# Patient Record
Sex: Male | Born: 1975 | Race: Black or African American | Hispanic: No | Marital: Married | Smoking: Never smoker
Health system: Southern US, Community
[De-identification: ages and names within clinical notes are randomized; demographics above are authoritative.]

## PROBLEM LIST (undated history)

## (undated) DIAGNOSIS — I10 Essential (primary) hypertension: Secondary | ICD-10-CM

## (undated) DIAGNOSIS — R42 Dizziness and giddiness: Secondary | ICD-10-CM

---

## 2016-10-06 ENCOUNTER — Observation Stay (HOSPITAL_COMMUNITY)
Admission: EM | Admit: 2016-10-06 | Discharge: 2016-10-07 | Disposition: A | Discharge status: Home / Self Care | Payer: Medicare Other | Attending: Internal Medicine | Admitting: Internal Medicine

## 2016-10-06 ENCOUNTER — Encounter (HOSPITAL_COMMUNITY): Payer: Self-pay | Admitting: Emergency Medicine

## 2016-10-06 ENCOUNTER — Observation Stay (HOSPITAL_COMMUNITY): Payer: Medicare Other

## 2016-10-06 DIAGNOSIS — E860 Dehydration: Secondary | ICD-10-CM | POA: Insufficient documentation

## 2016-10-06 DIAGNOSIS — R7309 Other abnormal glucose: Secondary | ICD-10-CM

## 2016-10-06 DIAGNOSIS — F1290 Cannabis use, unspecified, uncomplicated: Secondary | ICD-10-CM | POA: Diagnosis not present

## 2016-10-06 DIAGNOSIS — R197 Diarrhea, unspecified: Secondary | ICD-10-CM | POA: Diagnosis present

## 2016-10-06 DIAGNOSIS — H5500 Unspecified nystagmus: Secondary | ICD-10-CM | POA: Insufficient documentation

## 2016-10-06 DIAGNOSIS — R42 Dizziness and giddiness: Secondary | ICD-10-CM

## 2016-10-06 DIAGNOSIS — N179 Acute kidney failure, unspecified: Secondary | ICD-10-CM | POA: Diagnosis not present

## 2016-10-06 DIAGNOSIS — I10 Essential (primary) hypertension: Principal | ICD-10-CM

## 2016-10-06 DIAGNOSIS — R112 Nausea with vomiting, unspecified: Secondary | ICD-10-CM

## 2016-10-06 HISTORY — DX: Dizziness and giddiness: R42

## 2016-10-06 HISTORY — DX: Essential (primary) hypertension: I10

## 2016-10-06 LAB — URINALYSIS, ROUTINE W REFLEX MICROSCOPIC
BILIRUBIN URINE: NEGATIVE
Glucose, UA: NEGATIVE mg/dL
Hgb urine dipstick: NEGATIVE
KETONES UR: NEGATIVE mg/dL
LEUKOCYTES UA: NEGATIVE
NITRITE: NEGATIVE
PH: 7 (ref 5.0–8.0)
PROTEIN: NEGATIVE mg/dL
Specific Gravity, Urine: 1.012 (ref 1.005–1.030)

## 2016-10-06 LAB — CBC WITH DIFFERENTIAL/PLATELET
Basophils Absolute: 0 10*3/uL (ref 0.0–0.1)
Basophils Relative: 0 %
EOS PCT: 5 %
Eosinophils Absolute: 0.6 10*3/uL (ref 0.0–0.7)
HCT: 43.6 % (ref 39.0–52.0)
HEMOGLOBIN: 14.7 g/dL (ref 13.0–17.0)
LYMPHS ABS: 3.2 10*3/uL (ref 0.7–4.0)
LYMPHS PCT: 30 %
MCH: 31.4 pg (ref 26.0–34.0)
MCHC: 33.7 g/dL (ref 30.0–36.0)
MCV: 93.2 fL (ref 78.0–100.0)
Monocytes Absolute: 0.6 10*3/uL (ref 0.1–1.0)
Monocytes Relative: 6 %
NEUTROS PCT: 59 %
Neutro Abs: 6.4 10*3/uL (ref 1.7–7.7)
Platelets: 176 10*3/uL (ref 150–400)
RBC: 4.68 MIL/uL (ref 4.22–5.81)
RDW: 13 % (ref 11.5–15.5)
WBC: 10.8 10*3/uL — AB (ref 4.0–10.5)

## 2016-10-06 LAB — LACTIC ACID, PLASMA
LACTIC ACID, VENOUS: 1.9 mmol/L (ref 0.5–1.9)
LACTIC ACID, VENOUS: 1.7 mmol/L (ref 0.5–1.9)

## 2016-10-06 LAB — COMPREHENSIVE METABOLIC PANEL
ALT: 60 U/L (ref 17–63)
AST: 44 U/L — AB (ref 15–41)
Albumin: 4.4 g/dL (ref 3.5–5.0)
Alkaline Phosphatase: 47 U/L (ref 38–126)
Anion gap: 11 (ref 5–15)
BUN: 12 mg/dL (ref 6–20)
CHLORIDE: 106 mmol/L (ref 101–111)
CO2: 25 mmol/L (ref 22–32)
Calcium: 9.2 mg/dL (ref 8.9–10.3)
Creatinine, Ser: 1.35 mg/dL — ABNORMAL HIGH (ref 0.61–1.24)
GFR calc Af Amer: >60 mL/min (ref >60)
Glucose, Bld: 153 mg/dL — ABNORMAL HIGH (ref 65–99)
POTASSIUM: 3.9 mmol/L (ref 3.5–5.1)
SODIUM: 142 mmol/L (ref 135–145)
Total Bilirubin: 0.7 mg/dL (ref 0.3–1.2)
Total Protein: 7 g/dL (ref 6.5–8.1)

## 2016-10-06 LAB — LIPASE, BLOOD: Lipase: 39 U/L (ref 11–51)

## 2016-10-06 MED ORDER — ACETAMINOPHEN 325 MG PO TABS
650.0000 mg | ORAL_TABLET | Freq: Four times a day (QID) | ORAL | Status: DC | PRN
  Administered 2016-10-06 – 2016-10-07 (×2): 650 mg via ORAL
  Filled 2016-10-06 (×2): qty 2

## 2016-10-06 MED ORDER — LORAZEPAM 2 MG/ML IJ SOLN
1.0000 mg | Freq: Once | INTRAMUSCULAR | Status: AC
  Administered 2016-10-06: 1 mg via INTRAVENOUS
  Filled 2016-10-06: qty 1

## 2016-10-06 MED ORDER — ONDANSETRON HCL 4 MG PO TABS: 4.0000 mg | ORAL_TABLET | Freq: Four times a day (QID) | ORAL | Status: DC | PRN

## 2016-10-06 MED ORDER — SODIUM CHLORIDE 0.9 % IV SOLN
INTRAVENOUS | Status: AC
  Administered 2016-10-06: 13:00:00 via INTRAVENOUS

## 2016-10-06 MED ORDER — ONDANSETRON HCL 4 MG/2ML IJ SOLN
4.0000 mg | Freq: Once | INTRAMUSCULAR | Status: AC
  Administered 2016-10-06: 4 mg via INTRAVENOUS
  Filled 2016-10-06: qty 2

## 2016-10-06 MED ORDER — MECLIZINE HCL 25 MG PO TABS: 12.5000 mg | ORAL_TABLET | Freq: Three times a day (TID) | ORAL | Status: DC | PRN

## 2016-10-06 MED ORDER — SODIUM CHLORIDE 0.9 % IV BOLUS (SEPSIS)
1000.0000 mL | Freq: Once | INTRAVENOUS | Status: AC
  Administered 2016-10-06: 1000 mL via INTRAVENOUS

## 2016-10-06 MED ORDER — SENNOSIDES-DOCUSATE SODIUM 8.6-50 MG PO TABS
1.0000 | ORAL_TABLET | Freq: Every evening | ORAL | Status: DC | PRN
  Filled 2016-10-06: qty 1

## 2016-10-06 MED ORDER — ONDANSETRON 8 MG PO TBDP: 8.0000 mg | ORAL_TABLET | Freq: Three times a day (TID) | ORAL | 0 refills | Status: DC | PRN

## 2016-10-06 MED ORDER — BISACODYL 10 MG RE SUPP: 10.0000 mg | Freq: Every day | RECTAL | Status: DC | PRN

## 2016-10-06 MED ORDER — MECLIZINE HCL 25 MG PO TABS
25.0000 mg | ORAL_TABLET | Freq: Once | ORAL | Status: AC
  Administered 2016-10-06: 25 mg via ORAL
  Filled 2016-10-06: qty 1

## 2016-10-06 MED ORDER — ACETAMINOPHEN 650 MG RE SUPP: 650.0000 mg | Freq: Four times a day (QID) | RECTAL | Status: DC | PRN

## 2016-10-06 MED ORDER — PHENOBARBITAL SODIUM 65 MG/ML IJ SOLN: 32.5000 mg | Freq: Every day | INTRAMUSCULAR | Status: DC

## 2016-10-06 MED ORDER — ONDANSETRON HCL 4 MG/2ML IJ SOLN
4.0000 mg | Freq: Four times a day (QID) | INTRAMUSCULAR | Status: DC | PRN
  Administered 2016-10-06: 4 mg via INTRAVENOUS
  Filled 2016-10-06: qty 2

## 2016-10-06 MED ORDER — GABAPENTIN 300 MG PO CAPS
300.0000 mg | ORAL_CAPSULE | Freq: Two times a day (BID) | ORAL | Status: DC
  Administered 2016-10-07: 300 mg via ORAL
  Filled 2016-10-06: qty 1

## 2016-10-06 MED ORDER — HYDRALAZINE HCL 20 MG/ML IJ SOLN: 5.0000 mg | Freq: Three times a day (TID) | INTRAMUSCULAR | Status: DC | PRN

## 2016-10-06 MED ORDER — MECLIZINE HCL 25 MG PO TABS: 25.0000 mg | ORAL_TABLET | Freq: Two times a day (BID) | ORAL | 0 refills | Status: DC | PRN

## 2016-10-06 MED ORDER — GADOBENATE DIMEGLUMINE 529 MG/ML IV SOLN
20.0000 mL | Freq: Once | INTRAVENOUS | Status: AC | PRN
  Administered 2016-10-06: 20 mL via INTRAVENOUS

## 2016-10-06 MED ORDER — SCOPOLAMINE 1 MG/3DAYS TD PT72
1.0000 | MEDICATED_PATCH | TRANSDERMAL | Status: DC
  Administered 2016-10-06: 1.5 mg via TRANSDERMAL
  Filled 2016-10-06: qty 1

## 2016-10-06 MED ORDER — LORAZEPAM 2 MG/ML IJ SOLN: 1.0000 mg | Freq: Four times a day (QID) | INTRAMUSCULAR | Status: DC | PRN

## 2016-10-06 MED ORDER — TRAMADOL HCL 50 MG PO TABS: 50.0000 mg | ORAL_TABLET | ORAL | Status: DC | PRN

## 2016-10-06 MED ORDER — FENTANYL CITRATE (PF) 100 MCG/2ML IJ SOLN
50.0000 ug | Freq: Once | INTRAMUSCULAR | Status: AC
  Administered 2016-10-06: 50 ug via INTRAVENOUS
  Filled 2016-10-06: qty 2

## 2016-10-06 NOTE — ED Notes (Signed)
Pt ambulated independently, edp notified. Pt reports feeling better

## 2016-10-06 NOTE — ED Provider Notes (Signed)
Pt improved but still with vertigo like symptoms Will give IV fluids After that if improved can be discharged D/w dr Jeraldine Loots at [facility], MD 10/06/16 [phone]

## 2016-10-06 NOTE — H&P (Signed)
History and Physical    Marc Orr BJY:[id number] DOB: Jan 19, 1976 DOA: 10/06/2016   PCP: System, Pcp Not In   Patient coming from:  Home    Chief Complaint: Nausea and vomiting  HPI: Marc Orr is a 41 y.o. male with medical history significant for HTN, recently displaced from [facility], presenting to the ED around 4 AM with several day history of nausea, vomiting, dizziness, intermittent, but at the time of presentation became constant. The dizziness is worse with head movement. He denies any neck pain. No history of migraines. He denies any sick contacts. No history of labyrinthitis. He denies any ear pain or tinnitus. He denies any upper respiratory infection or flu like symptoms, with the exception of some chills. He denies any history or family history of stroke. He denies any history of heart disease. On presentation, he received multiple rounds of meclizine, Zofran, and Flexeril, initially with gold results, but then returning, provoking nausea and vomiting again. The patient reports an unsteady gait due to nausea, but denies any unilateral weakness. He admits to marijuana use, last taken last night. He denies any alcohol or cigarette use. He denies any chest pain or palpitations, shortness of breath or cough. He denies any abdominal pain. He denies any dysuria or hematuria. He denies any leg swelling. Of note, the patient reports several years ago to have same symptoms, which self resolved and did not seek medical attention.  ED Course:  BP (!) 153/90   Pulse 85   Temp 98.1 F (36.7 C) (Oral)   Resp 16   SpO2 99%     MRI is pending. Poor historian, unable to clarify if symptoms persist while resting. Initially responded to Ativan and Meclizine, but currently is again symptomatic White count 10.8 Creatinine 1.35 glucose 153.    Review of Systems:  As per HPI otherwise all other systems reviewed and are negative  Past Medical History:  Diagnosis Date  . Hypertension    . Vertigo     History reviewed. No pertinent surgical history.  Social History Social History   Social History  . Marital status: Married    Spouse name: N/A  . Number of children: N/A  . Years of education: N/A   Occupational History  . Not on file.   Social History Main Topics  . Smoking status: Never Smoker  . Smokeless tobacco: Never Used  . Alcohol use No  . Drug use: Yes    Types: Marijuana     Comment: last used yesterday  . Sexual activity: Not on file   Other Topics Concern  . Not on file   Social History Narrative  . No narrative on file     No Known Allergies  History reviewed. No pertinent family history.    Prior to Admission medications   Medication Sig Start Date End Date Taking? Authorizing Provider  meclizine (ANTIVERT) 25 MG tablet Take 1 tablet (25 mg total) by mouth 2 (two) times daily as needed for dizziness. 10/06/16   Zadie Rhine, MD  ondansetron (ZOFRAN ODT) 8 MG disintegrating tablet Take 1 tablet (8 mg total) by mouth every 8 (eight) hours as needed for refractory nausea / vomiting. 10/06/16   Zadie Rhine, MD    Physical Exam:  Vitals:   10/06/16 0900 10/06/16 0915 10/06/16 0930 10/06/16 1040  BP: (!) 147/94 (!) 150/88 (!) 147/74 (!) 153/90  Pulse: 78 78 74 85  Resp:    16  Temp:      TempSrc:  SpO2: 98% 100% 98% 99%   ConstitutionalVery uncomfortable, ill appearing. Eyes are closed, but he does respond to verbal stimulation, and answers questions properly, however he falls asleep frequently. eyes: His eyes are frequently closed, due to symptoms, but no discrete nystagmus is seen. Conjunctiva normal. ENMT: Mucous membranes are moist, without exudate or lesions  Neck: normal, supple, no masses, no thyromegaly Respiratory: clear to auscultation bilaterally, no wheezing, no crackles. Normal respiratory effort  Cardiovascular: Regular rate and rhythm,  murmur, rubs or gallops. No extremity edema. 2+ pedal pulses. No  carotid bruits.  Abdomen: Soft, non tender, No hepatosplenomegaly. Bowel sounds positive.  Musculoskeletal: no clubbing / cyanosis. Moves all extremities Skin: no jaundice, No lesions.  Neurologic: Sensation intact  Strength equal in all extremities. Patient and able to perform finger-to-nose, as he is very lethargic and falls asleep. No discrete nystagmus seen. Pupils equal  Psychiatric:   Alert and oriented x 3, very ill appearing, and chest due to symptoms.   Labs on Admission: I have personally reviewed following labs and imaging studies  CBC:  Recent Labs Lab 10/06/16 0421  WBC 10.8*  NEUTROABS 6.4  HGB 14.7  HCT 43.6  MCV 93.2  PLT 176    Basic Metabolic Panel:  Recent Labs Lab 10/06/16 0421  NA 142  K 3.9  CL 106  CO2 25  GLUCOSE 153*  BUN 12  CREATININE 1.35*  CALCIUM 9.2    GFR: CrCl cannot be calculated (Unknown ideal weight.).  Liver Function Tests:  Recent Labs Lab 10/06/16 0421  AST 44*  ALT 60  ALKPHOS 47  BILITOT 0.7  PROT 7.0  ALBUMIN 4.4    Recent Labs Lab 10/06/16 0421  LIPASE 39   No results for input(s): AMMONIA in the last 168 hours.  Coagulation Profile: No results for input(s): INR, PROTIME in the last 168 hours.  Cardiac Enzymes: No results for input(s): CKTOTAL, CKMB, CKMBINDEX, TROPONINI in the last 168 hours.  BNP (last 3 results) No results for input(s): PROBNP in the last 8760 hours.  HbA1C: No results for input(s): HGBA1C in the last 72 hours.  CBG: No results for input(s): GLUCAP in the last 168 hours.  Lipid Profile: No results for input(s): CHOL, HDL, LDLCALC, TRIG, CHOLHDL, LDLDIRECT in the last 72 hours.  Thyroid Function Tests: No results for input(s): TSH, T4TOTAL, FREET4, T3FREE, THYROIDAB in the last 72 hours.  Anemia Panel: No results for input(s): VITAMINB12, FOLATE, FERRITIN, TIBC, IRON, RETICCTPCT in the last 72 hours.  Urine analysis: No results found for: COLORURINE, APPEARANCEUR,  LABSPEC, PHURINE, GLUCOSEU, HGBUR, BILIRUBINUR, KETONESUR, PROTEINUR, UROBILINOGEN, NITRITE, LEUKOCYTESUR  Sepsis Labs: (procalcitonin:4,lacticidven:4) )No results found for this or any previous visit (from the past 240 hour(s)).   Radiological Exams on Admission: No results found.  EKG: Independently reviewed.  Assessment/Plan Active Problems:   Vertigo   Hypertension   Elevated glucose    Vertigo accompanied by nausea and vomiting.History of frequent Marijuana use.  He denies fever, did have chills. WBC 10.9. No history of stroke. Does have HTN, not on meds, Denies h/o Migraines.  MRI is pending. Poor historian, unable to clarify if symptoms persist while resting. Initially responded to Ativan and Meclizine, but currently is again symptomatic  Telemetry Observation Continue Ativan and Meclizine Zofran IV   Continue IVF Check Orthostatics Await for MRI results. If + for stroke, will request NEuro consult, and proceed with full Neuro workup. D/c Marijuana Will check Resp vir panel    Hypertension BP 153/90  Pulse 85 Patient not on meds at home.  Permissive HTN for now until MRI results r/o stroke  Add Hydralazine Q6 hours as needed for BP 210/100   Acute Kidney Injury likely due to dehydration, rule out infection  BL Cr unable to obtain, no records available at this time [facility]) Received generous IVF, currently 125 cc/h  Lab Results  Component Value Date   CREATININE 1.35 (H) 10/06/2016  IVF CMET in am    Elevated Glucose Patient noted to have elevated Glucose in labs at 153 fasting . No prior documented history of DM Check A1C Check CBG bid    Mild Leukocytosis, likely reactive versus related to underlying infection, WBC  10.9  Afebrile   IVF   Repeat CBC in AM Blood Cx Check UA  Check lactic acid   DVT prophylaxis:  SCD until results MRI neg for Stroke Code Status:    Full  Family Communication:  Discussed with patient Disposition Plan:  Expect patient to be discharged to home after condition improves Consults called:    None  Admission status: Tele OBS   Cherrise Occhipinti E, PA-C [facility]   10/06/2016, 1:20 PM

## 2016-10-06 NOTE — ED Notes (Signed)
Dr. Evelena Peat at bedside

## 2016-10-06 NOTE — ED Notes (Signed)
This tech went to get pt into wheelchair to take to lobby after pt had been discharged; pt sat up, but reported feeling dizzy and nauseated; pt proceeded to vomit into emesis bag; RN, MD made aware

## 2016-10-06 NOTE — ED Notes (Signed)
Spoke with patients wife to update.

## 2016-10-06 NOTE — ED Provider Notes (Signed)
[facility]-EMERGENCY DEPT Provider Note   CSN: [id number] Arrival date & time: 10/06/16  0351     History   Chief Complaint Chief Complaint  Patient presents with  . Emesis    HPI Marc Orr is a 41 y.o. male.  The history is provided by the patient and the spouse.  Emesis   This is a new problem. The current episode started 12 to 24 hours ago. The problem has been rapidly worsening. There has been no fever. Associated symptoms include abdominal pain, chills and diarrhea.  Patient presents with multiple episodes of nausea/vomiting/diarrhea and abdominal pain Reports h/o "stomach issues" but is unable to provide any further details.   No sick contacts, however they are staying locally due to displaced by [facility] - chronic back pain Soc hx - lives near [place] displaced by [facility] Medications    Prior to Admission medications   Not on File    Family History No family history on file.  Social History Social History  Substance Use Topics  . Smoking status: Not on file  . Smokeless tobacco: Not on file  . Alcohol use Not on file     Allergies   Patient has no allergy information on record.   Review of Systems Review of Systems  Constitutional: Positive for chills.  Gastrointestinal: Positive for abdominal pain, diarrhea and vomiting.  All other systems reviewed and are negative.    Physical Exam Updated Vital Signs BP (!) 154/91 (BP Location: Left Arm)   Pulse 71   Temp 98.1 F (36.7 C) (Oral)   Resp 20   SpO2 100%   Physical Exam  CONSTITUTIONAL: Uncomfortable, ill appearing HEAD: Normocephalic/atraumatic EYES: EOMI/PERRL, no icterus ENMT: Mucous membranes dry NECK: supple no meningeal signs CV: S1/S2 noted, no murmurs/rubs/gallops noted LUNGS: Lungs are clear to auscultation bilaterally, no apparent distress ABDOMEN: soft, nontender, no rebound or guarding, bowel sounds noted throughout abdomen NEURO: Pt is  awake/alert/appropriate, moves all extremitiesx4.  No facial droop.   EXTREMITIES: pulses normal/equal, full ROM SKIN: warm, color normal PSYCH: unable to assess ED Treatments / Results  Labs (all labs ordered are listed, but only abnormal results are displayed) Labs Reviewed  COMPREHENSIVE METABOLIC PANEL - Abnormal; Notable for the following:       Result Value   Glucose, Bld 153 (*)    Creatinine, Ser 1.35 (*)    AST 44 (*)    All other components within normal limits  CBC WITH DIFFERENTIAL/PLATELET - Abnormal; Notable for the following:    WBC 10.8 (*)    All other components within normal limits  LIPASE, BLOOD    EKG  EKG Interpretation  Date/Time:  Monday October 06 2016 04:59:36 EDT Ventricular Rate:  72 PR Interval:    QRS Duration: 110 QT Interval:  397 QTC Calculation: 435 R Axis:   66 Text Interpretation:  Sinus rhythm Borderline prolonged PR interval RSR' in V1 or V2, probably normal variant No previous ECGs available Confirmed by Zadie Rhine ([id number]) on 10/06/2016 5:39:59 AM       Radiology No results found.  Procedures Procedures (including critical care time)  Medications Ordered in ED Medications  ondansetron (ZOFRAN) injection 4 mg (4 mg Intravenous Given 10/06/16 0413)  sodium chloride 0.9 % bolus 1,000 mL (0 mLs Intravenous Stopped 10/06/16 0558)  fentaNYL (SUBLIMAZE) injection 50 mcg (50 mcg Intravenous Given 10/06/16 0458)  ondansetron (ZOFRAN) injection 4 mg (4 mg Intravenous Given 10/06/16 0611)  meclizine (ANTIVERT) tablet 25 mg (  25 mg Oral Given 10/06/16 0703)     Initial Impression / Assessment and Plan / ED Course  I have reviewed the triage vital signs and the nursing notes.  Pertinent labs  results that were available during my care of the patient were reviewed by me and considered in my medical decision making (see chart for details).     5:41 AM Pt beginning to feel improved abd soft/nontender Will try PO challenge He  suspects recent poor diet may have led to his vomiting/diarrhea tonight 7:10 AM Pt improved, though he did have some residual vomiting after ambulating No focal weakness No ataxia   Will give course of both zofran and antivert  Discussed strict ER return precautions  Final Clinical Impressions(s) / ED Diagnoses   Final diagnoses:  Nausea vomiting and diarrhea  Dizziness    New Prescriptions New Prescriptions   MECLIZINE (ANTIVERT) 25 MG TABLET    Take 1 tablet (25 mg total) by mouth 2 (two) times daily as needed for dizziness.   ONDANSETRON (ZOFRAN ODT) 8 MG DISINTEGRATING TABLET    Take 1 tablet (8 mg total) by mouth every 8 (eight) hours as needed for refractory nausea / vomiting.     Zadie Rhine, MD 10/06/16 [phone]

## 2016-10-06 NOTE — ED Triage Notes (Signed)
Brought by ems for c/o n/v/d.  Reports everyone in family ate chinese food and ended up with similar symptoms.  Also c/o abdominal cramping in lower quads.

## 2016-10-06 NOTE — ED Notes (Signed)
Pt is feeling nauseous again, reports feeling weak and not feeling any better, edp notified and at bedside.

## 2016-10-06 NOTE — ED Provider Notes (Addendum)
Care assumed at signout. Patient has received IV fluids, Ativan, is much more calm, is no longer vomiting.  9:45 AM After sleeping, patient is now ambulatory, without persistent dizziness, without fall, without difficulty.   Gerhard Munch, MD 10/06/16 0945  10:37 AM Although the patient was ambulatory, stated that he felt better, he soon thereafter had another episode of profound vomiting, recurrent dizziness.  12:28 PM Patient remains nauseous, with vomiting. Symptoms improve with Zofran, fluids, lack of motion, but given the persistent nausea, vomiting, dizziness, the patient will be admitted for further monitoring, management.    Gerhard Munch, MD 10/06/16 1228

## 2016-10-06 NOTE — ED Notes (Signed)
gingerale given to patient 

## 2016-10-06 NOTE — ED Notes (Signed)
Pt stood up to get in wheelchair to be discharged and began feeling very dizzy and started throwing up again, edp notified.

## 2016-10-06 NOTE — ED Notes (Signed)
Wife would like to be called with any updates. [phone] [place]

## 2016-10-06 NOTE — Progress Notes (Signed)
Per discussion with MD, no more ativan to be administered prior to MRI as pt has already had  today.

## 2016-10-07 DIAGNOSIS — R7309 Other abnormal glucose: Secondary | ICD-10-CM | POA: Diagnosis not present

## 2016-10-07 DIAGNOSIS — I10 Essential (primary) hypertension: Secondary | ICD-10-CM | POA: Diagnosis not present

## 2016-10-07 DIAGNOSIS — R112 Nausea with vomiting, unspecified: Secondary | ICD-10-CM | POA: Diagnosis not present

## 2016-10-07 DIAGNOSIS — R197 Diarrhea, unspecified: Secondary | ICD-10-CM | POA: Diagnosis not present

## 2016-10-07 DIAGNOSIS — R42 Dizziness and giddiness: Secondary | ICD-10-CM | POA: Diagnosis not present

## 2016-10-07 LAB — RESPIRATORY PANEL BY PCR
Adenovirus: NOT DETECTED
BORDETELLA PERTUSSIS-RVPCR: NOT DETECTED
CHLAMYDOPHILA PNEUMONIAE-RVPPCR: NOT DETECTED
CORONAVIRUS HKU1-RVPPCR: NOT DETECTED
CORONAVIRUS NL63-RVPPCR: NOT DETECTED
Coronavirus 229E: NOT DETECTED
Coronavirus OC43: NOT DETECTED
INFLUENZA A H1 2009-RVPPR: NOT DETECTED
INFLUENZA A H3-RVPPCR: NOT DETECTED
INFLUENZA B-RVPPCR: NOT DETECTED
Influenza A H1: NOT DETECTED
Influenza A: NOT DETECTED
METAPNEUMOVIRUS-RVPPCR: NOT DETECTED
MYCOPLASMA PNEUMONIAE-RVPPCR: NOT DETECTED
PARAINFLUENZA VIRUS 2-RVPPCR: NOT DETECTED
Parainfluenza Virus 1: NOT DETECTED
Parainfluenza Virus 3: NOT DETECTED
Parainfluenza Virus 4: NOT DETECTED
Respiratory Syncytial Virus: NOT DETECTED
Rhinovirus / Enterovirus: NOT DETECTED

## 2016-10-07 LAB — COMPREHENSIVE METABOLIC PANEL
ALBUMIN: 3.8 g/dL (ref 3.5–5.0)
ALK PHOS: 43 U/L (ref 38–126)
ALT: 45 U/L (ref 17–63)
AST: 29 U/L (ref 15–41)
Anion gap: 7 (ref 5–15)
BILIRUBIN TOTAL: 0.8 mg/dL (ref 0.3–1.2)
BUN: 7 mg/dL (ref 6–20)
CALCIUM: 8.8 mg/dL — AB (ref 8.9–10.3)
CO2: 24 mmol/L (ref 22–32)
CREATININE: 1.38 mg/dL — AB (ref 0.61–1.24)
Chloride: 108 mmol/L (ref 101–111)
GFR calc Af Amer: >60 mL/min (ref >60)
GFR calc non Af Amer: >60 mL/min (ref >60)
GLUCOSE: 104 mg/dL — AB (ref 65–99)
Potassium: 3.4 mmol/L — ABNORMAL LOW (ref 3.5–5.1)
SODIUM: 139 mmol/L (ref 135–145)
Total Protein: 6.5 g/dL (ref 6.5–8.1)

## 2016-10-07 LAB — BLOOD CULTURE ID PANEL (REFLEXED)
Acinetobacter baumannii: NOT DETECTED
CANDIDA ALBICANS: NOT DETECTED
CANDIDA KRUSEI: NOT DETECTED
CANDIDA PARAPSILOSIS: NOT DETECTED
Candida glabrata: NOT DETECTED
Candida tropicalis: NOT DETECTED
Carbapenem resistance: NOT DETECTED
ENTEROCOCCUS SPECIES: NOT DETECTED
Enterobacter cloacae complex: NOT DETECTED
Enterobacteriaceae species: NOT DETECTED
Escherichia coli: NOT DETECTED
Haemophilus influenzae: NOT DETECTED
KLEBSIELLA PNEUMONIAE: NOT DETECTED
Klebsiella oxytoca: NOT DETECTED
Listeria monocytogenes: NOT DETECTED
Methicillin resistance: NOT DETECTED
NEISSERIA MENINGITIDIS: NOT DETECTED
PROTEUS SPECIES: NOT DETECTED
PSEUDOMONAS AERUGINOSA: NOT DETECTED
SERRATIA MARCESCENS: NOT DETECTED
STAPHYLOCOCCUS AUREUS BCID: NOT DETECTED
STREPTOCOCCUS AGALACTIAE: NOT DETECTED
STREPTOCOCCUS PNEUMONIAE: NOT DETECTED
STREPTOCOCCUS SPECIES: NOT DETECTED
Staphylococcus species: DETECTED — AB
Streptococcus pyogenes: NOT DETECTED
VANCOMYCIN RESISTANCE: NOT DETECTED

## 2016-10-07 LAB — CBC
HCT: 42.4 % (ref 39.0–52.0)
Hemoglobin: 13.9 g/dL (ref 13.0–17.0)
MCH: 30.3 pg (ref 26.0–34.0)
MCHC: 32.8 g/dL (ref 30.0–36.0)
MCV: 92.6 fL (ref 78.0–100.0)
PLATELETS: 191 10*3/uL (ref 150–400)
RBC: 4.58 MIL/uL (ref 4.22–5.81)
RDW: 13.3 % (ref 11.5–15.5)
WBC: 9.5 10*3/uL (ref 4.0–10.5)

## 2016-10-07 LAB — HIV ANTIBODY (ROUTINE TESTING W REFLEX): HIV SCREEN 4TH GENERATION: NONREACTIVE
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
## 2016-10-07 NOTE — Progress Notes (Signed)
Spoke with pharmacy concerning sedation pt received  IV Ativan and had a order for Phenobarbital 32.5 mg IV. Was suggested to call MD concerning the order and [facility] was paged.Informed that medication is not listed on home medication and that patient has no history of seizures or convulsions. New orders received will continue to monitor, Ilean Skill LPN

## 2016-10-07 NOTE — Progress Notes (Signed)
PHARMACY - PHYSICIAN COMMUNICATION CRITICAL VALUE ALERT - BLOOD CULTURE IDENTIFICATION (BCID)  Results for orders placed or performed during the hospital encounter of 10/06/16  Blood Culture ID Panel (Reflexed) (Collected: 10/06/2016  3:23 PM)  Result Value Ref Range   Enterococcus species NOT DETECTED NOT DETECTED   Vancomycin resistance NOT DETECTED NOT DETECTED   Listeria monocytogenes NOT DETECTED NOT DETECTED   Staphylococcus species DETECTED (A) NOT DETECTED   Staphylococcus aureus NOT DETECTED NOT DETECTED   Methicillin resistance NOT DETECTED NOT DETECTED   Streptococcus species NOT DETECTED NOT DETECTED   Streptococcus agalactiae NOT DETECTED NOT DETECTED   Streptococcus pneumoniae NOT DETECTED NOT DETECTED   Streptococcus pyogenes NOT DETECTED NOT DETECTED   Acinetobacter baumannii NOT DETECTED NOT DETECTED   Enterobacteriaceae species NOT DETECTED NOT DETECTED   Enterobacter cloacae complex NOT DETECTED NOT DETECTED   Escherichia coli NOT DETECTED NOT DETECTED   Klebsiella oxytoca NOT DETECTED NOT DETECTED   Klebsiella pneumoniae NOT DETECTED NOT DETECTED   Proteus species NOT DETECTED NOT DETECTED   Serratia marcescens NOT DETECTED NOT DETECTED   Carbapenem resistance NOT DETECTED NOT DETECTED   Haemophilus influenzae NOT DETECTED NOT DETECTED   Neisseria meningitidis NOT DETECTED NOT DETECTED   Pseudomonas aeruginosa NOT DETECTED NOT DETECTED   Candida albicans NOT DETECTED NOT DETECTED   Candida glabrata NOT DETECTED NOT DETECTED   Candida krusei NOT DETECTED NOT DETECTED   Candida parapsilosis NOT DETECTED NOT DETECTED   Candida tropicalis NOT DETECTED NOT DETECTED    Name of physician (or Provider) Contacted: Dr. Ella Jubilee  Changes to prescribed antibiotics required: suspect contaminant. No changes in antibiotics unless clinical suspicion for infection.   Pollyann Samples, PharmD, BCPS 10/07/2016, 10:47 AM P

## 2016-10-09 LAB — CULTURE, BLOOD (ROUTINE X 2): Special Requests: ADEQUATE

## 2016-10-11 LAB — CULTURE, BLOOD (ROUTINE X 2)
CULTURE: NO GROWTH
SPECIAL REQUESTS: ADEQUATE

## 2019-01-08 IMAGING — MR MR HEAD WO/W CM
10 of 12 series · 35 of 48 positions shown · IV contrast (multihance)
Comparison: None available.

CLINICAL DATA: Initial evaluation for acute nausea with vomiting.
Nystagmus, vertigo.

EXAM:
MRI HEAD WITHOUT AND WITH CONTRAST
TECHNIQUE: Multiplanar, multiecho pulse sequences of the brain and surrounding
structures were obtained without and with intravenous contrast.
CONTRAST:  20mL MULTIHANCE GADOBENATE DIMEGLUMINE 529 MG/ML IV SOLN

[Series 3: DWI · axial · 3.0mm · 0.94mm/px · z∈[+0,+147]mm · 9 of 100 slices shown (1 of 2)]
[im 1/100]
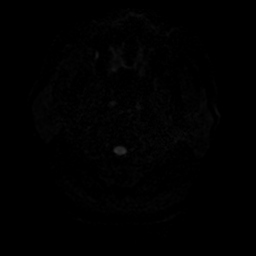
[im 13/100]
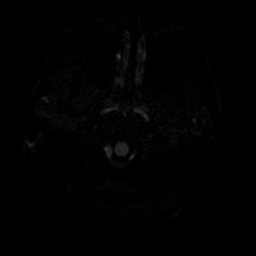
[im 25/100]
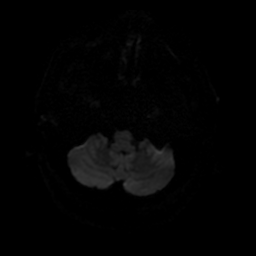
[im 38/100]
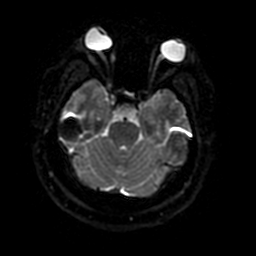
[im 50/100]
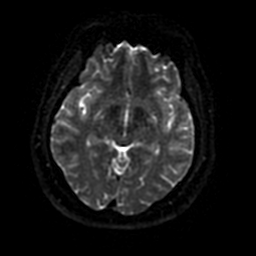
[im 62/100]
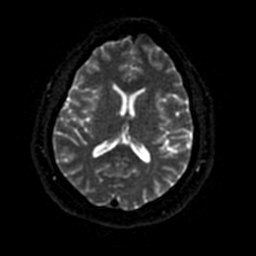
[im 75/100]
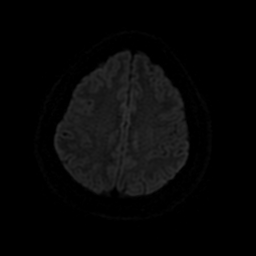
[im 87/100]
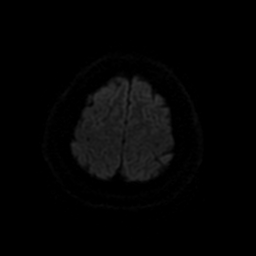
[im 100/100]
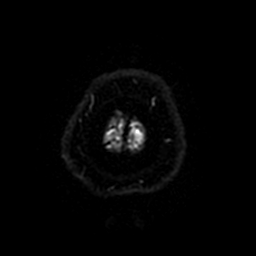

[Series 4: DWI · coronal · 4.0mm · 0.94mm/px · 6 of 68 slices shown (2 of 2)]
[im 1/68]
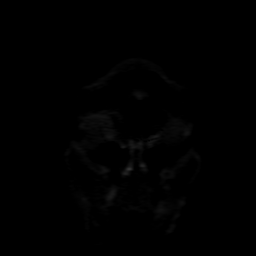
[im 14/68]
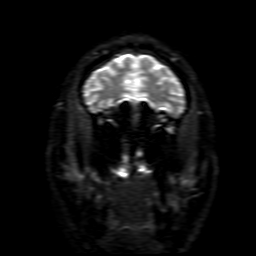
[im 27/68]
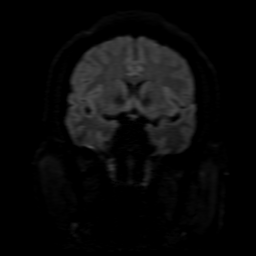
[im 41/68]
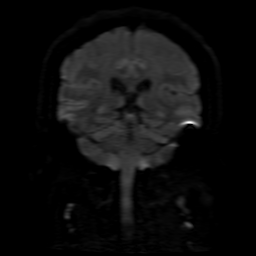
[im 54/68]
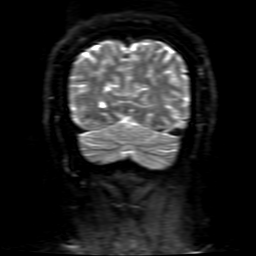
[im 68/68]
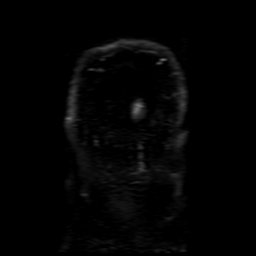

[Series 5: FLAIR · sagittal · 5.0mm · 0.47mm/px · 2 of 23 slices shown (1 of 2)]
[im 1/23]
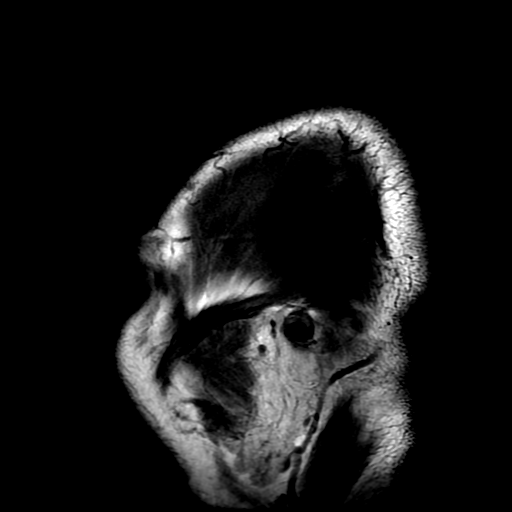
[im 23/23]
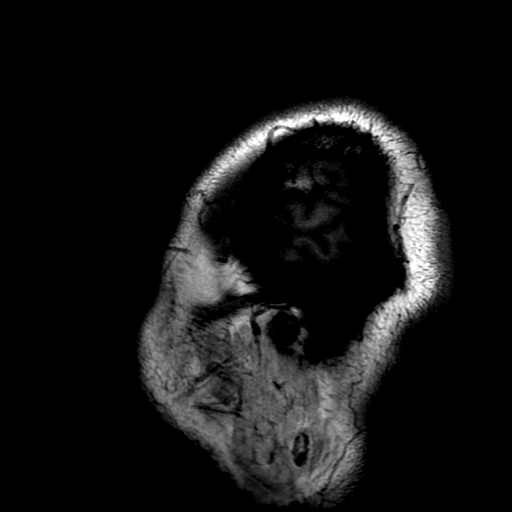

[Series 6: T2 · axial · 5.0mm · 0.47mm/px · z∈[+2,+146]mm · 2 of 25 slices shown (1 of 2)]
[im 1/25]
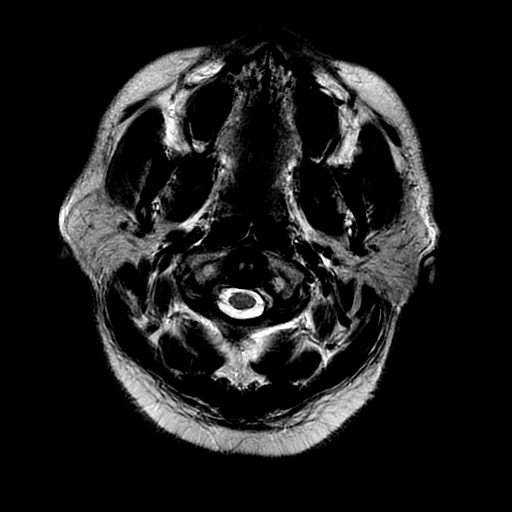
[im 25/25]
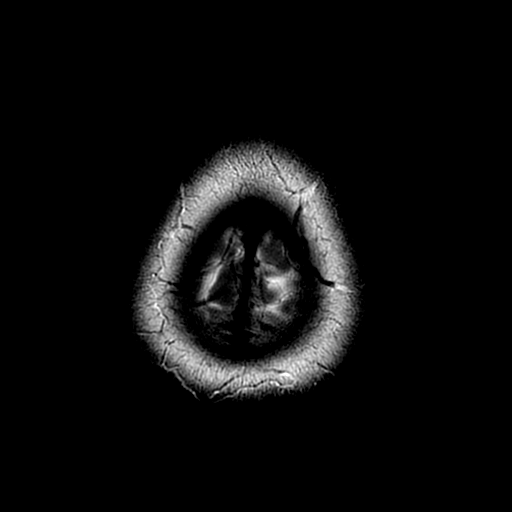

[Series 7: FLAIR · axial · 5.0mm · 0.47mm/px · z∈[+2,+146]mm · 2 of 25 slices shown (2 of 2)]
[im 1/25]
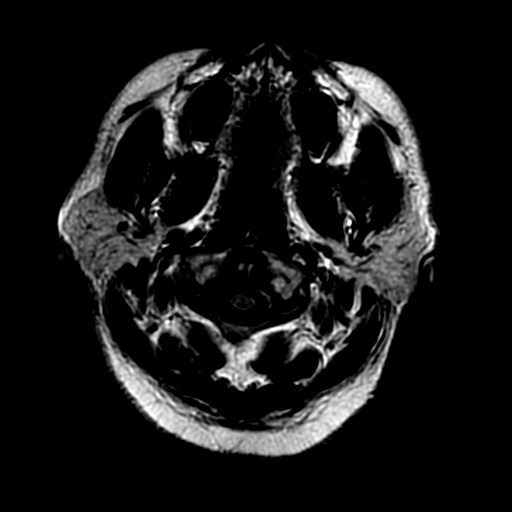
[im 25/25]
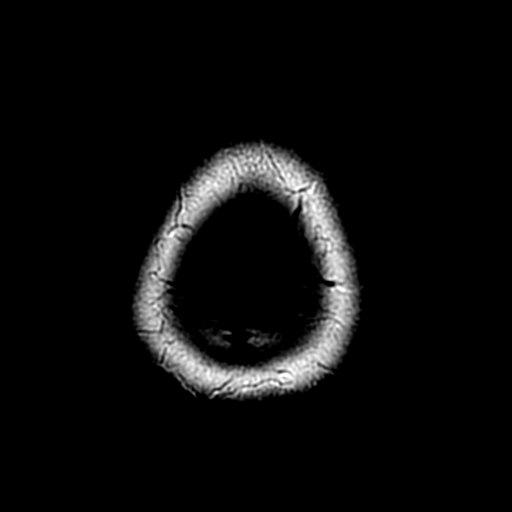

[Series 8: (person_name) · axial · 3.0mm · 0.47mm/px · z∈[+0,+42]mm · 3 of 100 slices shown]
[im 1/100]
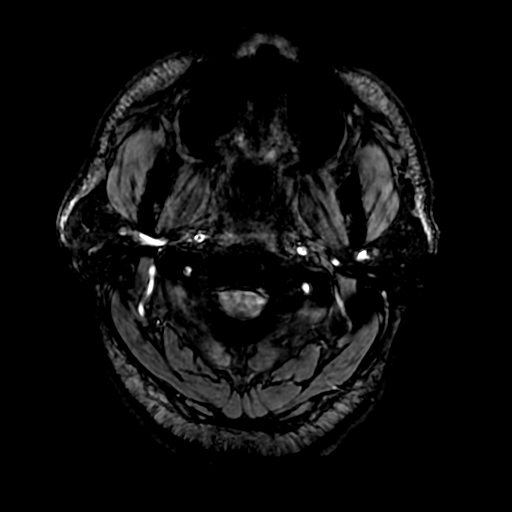
[im 15/100]
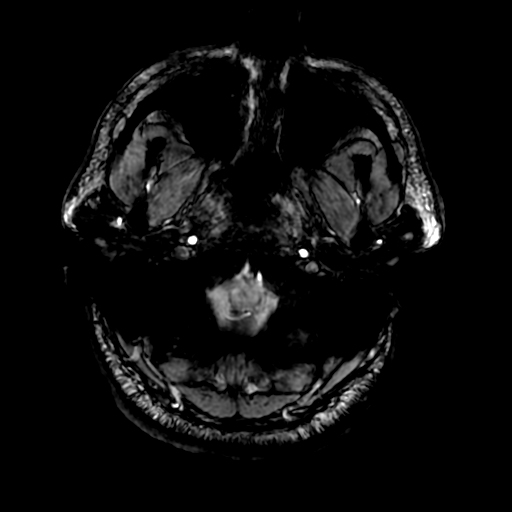
[im 29/100]
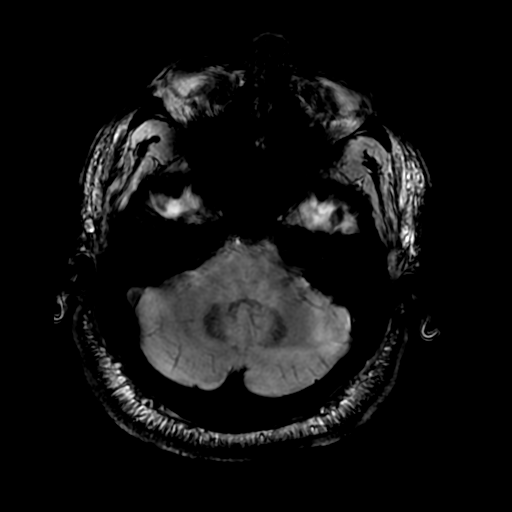

[Series 10: T2 · coronal · 5.0mm · 0.94mm/px · 2 of 28 slices shown (2 of 2)]
[im 1/28]
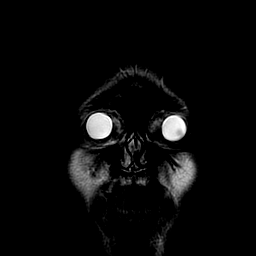
[im 28/28]
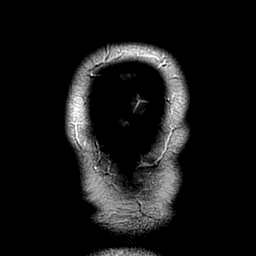

[Series 12: T1 · coronal · 5.0mm · 0.43mm/px · 2 of 28 slices shown]
[im 1/28]
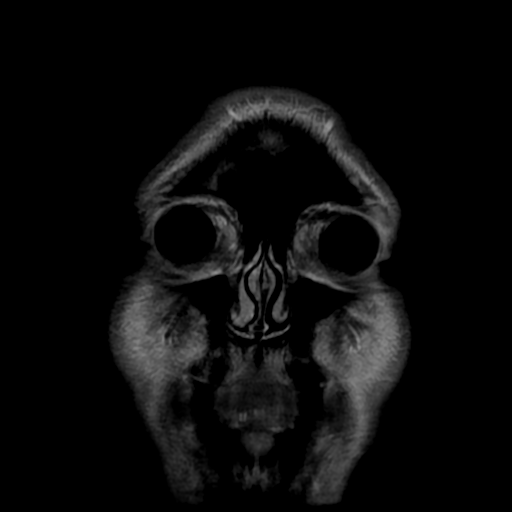
[im 28/28]
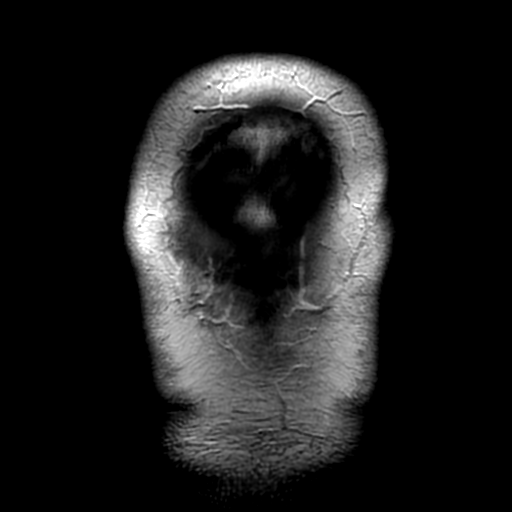

[Series 350: ADC · axial · 3.0mm · 0.94mm/px · z∈[+0,+147]mm · 4 of 50 slices shown (1 of 2)]
[im 1/50]
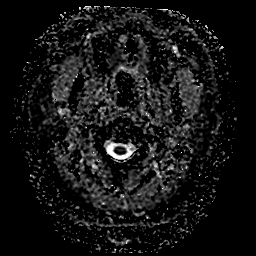
[im 17/50]
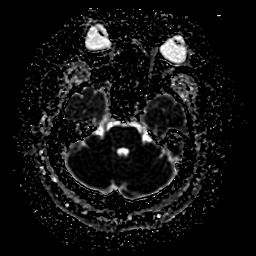
[im 33/50]
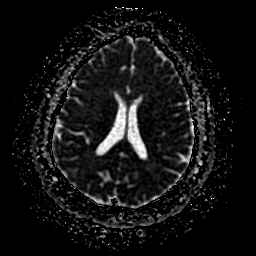
[im 50/50]
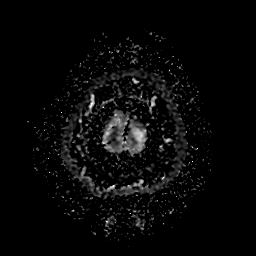

[Series 450: ADC · coronal · 4.0mm · 0.94mm/px · 3 of 34 slices shown (2 of 2)]
[im 1/34]
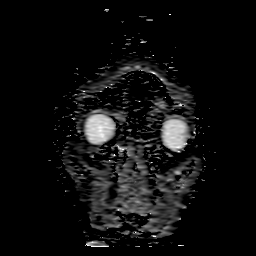
[im 17/34]
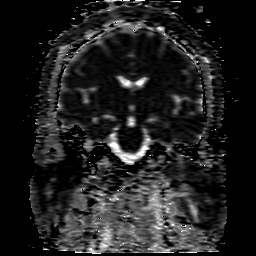
[im 34/34]
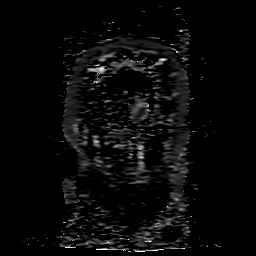

[35 of 48 positions shown; findings below may reference images not displayed]

FINDINGS: Brain: Cerebral volume normal for age. Few tiny subcentimeter
T2/FLAIR hyperintense foci noted within the deep white matter of
both cerebral hemispheres bilaterally, nonspecific, but felt to be
within normal limits for age. No abnormal foci of restricted
diffusion to suggest acute or subacute ischemia. No encephalomalacia
to suggest chronic infarction. No foci susceptibility artifact to
suggest acute or chronic intracranial hemorrhage.

No mass lesion, midline shift or mass effect. No hydrocephalus. No
extra-axial fluid collection. No abnormal enhancement. Incidental
note made of a small DVA at the right occipital lobe. Major dural
sinuses are patent.

Pituitary gland suprasellar region normal. Midline structures intact
and normal.

Vascular: Major intracranial vascular flow voids are well
maintained.

Skull and upper cervical spine: Craniocervical junction within
normal limits. Degenerative disc bulge noted at C3-4 with resultant
mild spinal stenosis. Visualized upper cervical spine otherwise
unremarkable. Bone marrow signal intensity normal. No scalp soft
tissue abnormality.

Sinuses/Orbits: Globes and orbital soft tissues within normal
limits. Paranasal sinuses are clear. No significant mastoid
effusion. Inner ear structures normal.

Other: None.
IMPRESSION: Normal MRI of the brain. No acute intracranial abnormality
identified.
